# Patient Record
Sex: Female | Born: 1970 | Race: White | Hispanic: No | Marital: Married | Smoking: Never smoker
Health system: Southern US, Community
[De-identification: ages and names within clinical notes are randomized; demographics above are authoritative.]

## PROBLEM LIST (undated history)

## (undated) DIAGNOSIS — E119 Type 2 diabetes mellitus without complications: Secondary | ICD-10-CM

---

## 2001-06-15 ENCOUNTER — Other Ambulatory Visit: Admission: RE | Admit: 2001-06-15 | Discharge: 2001-06-15 | Payer: Self-pay | Admitting: Obstetrics and Gynecology

## 2001-06-21 ENCOUNTER — Ambulatory Visit (HOSPITAL_COMMUNITY): Admission: RE | Admit: 2001-06-21 | Discharge: 2001-06-21 | Payer: Self-pay | Admitting: Endocrinology

## 2001-06-30 ENCOUNTER — Encounter: Payer: Self-pay | Admitting: Obstetrics and Gynecology

## 2001-06-30 ENCOUNTER — Ambulatory Visit (HOSPITAL_COMMUNITY): Admission: RE | Admit: 2001-06-30 | Discharge: 2001-06-30 | Payer: Self-pay | Admitting: Obstetrics and Gynecology

## 2002-06-16 ENCOUNTER — Other Ambulatory Visit: Admission: RE | Admit: 2002-06-16 | Discharge: 2002-06-16 | Payer: Self-pay | Admitting: Obstetrics and Gynecology

## 2003-04-17 ENCOUNTER — Encounter (INDEPENDENT_AMBULATORY_CARE_PROVIDER_SITE_OTHER): Payer: Self-pay | Admitting: Specialist

## 2003-04-17 ENCOUNTER — Ambulatory Visit (HOSPITAL_COMMUNITY): Admission: RE | Admit: 2003-04-17 | Discharge: 2003-04-17 | Payer: Self-pay | Admitting: Obstetrics and Gynecology

## 2003-10-30 ENCOUNTER — Other Ambulatory Visit: Admission: RE | Admit: 2003-10-30 | Discharge: 2003-10-30 | Payer: Self-pay | Admitting: Obstetrics and Gynecology

## 2004-04-16 ENCOUNTER — Inpatient Hospital Stay (HOSPITAL_COMMUNITY): Admission: AD | Admit: 2004-04-16 | Discharge: 2004-04-16 | Payer: Self-pay | Admitting: Obstetrics and Gynecology

## 2004-04-16 ENCOUNTER — Ambulatory Visit (HOSPITAL_COMMUNITY): Admission: RE | Admit: 2004-04-16 | Discharge: 2004-04-16 | Payer: Self-pay | Admitting: Obstetrics and Gynecology

## 2004-04-24 ENCOUNTER — Inpatient Hospital Stay (HOSPITAL_COMMUNITY): Admission: AD | Admit: 2004-04-24 | Discharge: 2004-04-24 | Payer: Self-pay | Admitting: *Deleted

## 2006-04-13 ENCOUNTER — Inpatient Hospital Stay (HOSPITAL_COMMUNITY): Admission: RE | Admit: 2006-04-13 | Discharge: 2006-04-16 | Payer: Self-pay | Admitting: Obstetrics and Gynecology

## 2006-04-13 ENCOUNTER — Encounter (INDEPENDENT_AMBULATORY_CARE_PROVIDER_SITE_OTHER): Payer: Self-pay | Admitting: *Deleted

## 2007-11-22 ENCOUNTER — Ambulatory Visit (HOSPITAL_COMMUNITY): Admission: RE | Admit: 2007-11-22 | Discharge: 2007-11-22 | Payer: Self-pay | Admitting: Obstetrics and Gynecology

## 2007-12-13 ENCOUNTER — Ambulatory Visit (HOSPITAL_COMMUNITY): Admission: RE | Admit: 2007-12-13 | Discharge: 2007-12-13 | Payer: Self-pay | Admitting: Obstetrics and Gynecology

## 2008-01-11 ENCOUNTER — Ambulatory Visit (HOSPITAL_COMMUNITY): Admission: RE | Admit: 2008-01-11 | Discharge: 2008-01-11 | Payer: Self-pay | Admitting: Obstetrics and Gynecology

## 2008-02-08 ENCOUNTER — Ambulatory Visit (HOSPITAL_COMMUNITY): Admission: RE | Admit: 2008-02-08 | Discharge: 2008-02-08 | Payer: Self-pay | Admitting: Obstetrics and Gynecology

## 2008-03-20 ENCOUNTER — Inpatient Hospital Stay (HOSPITAL_COMMUNITY): Admission: AD | Admit: 2008-03-20 | Discharge: 2008-03-20 | Payer: Self-pay | Admitting: Obstetrics and Gynecology

## 2008-03-26 ENCOUNTER — Inpatient Hospital Stay (HOSPITAL_COMMUNITY): Admission: AD | Admit: 2008-03-26 | Discharge: 2008-03-31 | Payer: Self-pay | Admitting: Obstetrics

## 2008-04-16 ENCOUNTER — Encounter (INDEPENDENT_AMBULATORY_CARE_PROVIDER_SITE_OTHER): Payer: Self-pay | Admitting: Obstetrics and Gynecology

## 2008-04-16 ENCOUNTER — Ambulatory Visit: Payer: Self-pay | Admitting: Vascular Surgery

## 2008-04-16 ENCOUNTER — Ambulatory Visit: Admission: RE | Admit: 2008-04-16 | Discharge: 2008-04-16 | Payer: Self-pay | Admitting: Obstetrics and Gynecology

## 2008-04-27 ENCOUNTER — Inpatient Hospital Stay (HOSPITAL_COMMUNITY): Admission: RE | Admit: 2008-04-27 | Discharge: 2008-04-29 | Payer: Self-pay | Admitting: Obstetrics and Gynecology

## 2010-01-28 ENCOUNTER — Ambulatory Visit (HOSPITAL_COMMUNITY): Admission: RE | Admit: 2010-01-28 | Discharge: 2010-01-28 | Payer: Self-pay | Admitting: Obstetrics and Gynecology

## 2010-11-02 ENCOUNTER — Encounter: Payer: Self-pay | Admitting: Obstetrics and Gynecology

## 2010-12-30 LAB — BASIC METABOLIC PANEL
Chloride: 102 mEq/L (ref 96–112)
Creatinine, Ser: 0.54 mg/dL (ref 0.4–1.2)
GFR calc Af Amer: >60 mL/min (ref >60)
Sodium: 136 mEq/L (ref 135–145)

## 2010-12-30 LAB — CBC
MCV: 88.3 fL (ref 78.0–100.0)
RBC: 4.03 MIL/uL (ref 3.87–5.11)
WBC: 8.1 10*3/uL (ref 4.0–10.5)

## 2010-12-30 LAB — GLUCOSE, CAPILLARY
Glucose-Capillary: 86 mg/dL (ref 70–99)
Glucose-Capillary: 99 mg/dL (ref 70–99)

## 2011-02-24 NOTE — Op Note (Signed)
Valerie Hunt, Valerie Hunt             ACCOUNT NO.:  [id number]   MEDICAL RECORD NO.:  [id number]          PATIENT TYPE:  INP   LOCATION:  9121                          [facility]   PHYSICIAN:  Maxie Better, M.D.DATE OF BIRTH:  1970/11/14   DATE OF PROCEDURE:  04/27/2008  DATE OF DISCHARGE:                               OPERATIVE REPORT   PREOPERATIVE DIAGNOSES:  1. Previous cesarean section.  2. Term gestation.  3. Class B diabetes.  4. Oligohydramnios.   POSTOPERATIVE DIAGNOSES:  1. Previous cesarean section.  2. Term gestation.  3. Class B diabetes.   PROCEDURE:  1. Repeat cesarean section.  2. Sharl Ma hysterotomy.   ANESTHESIA:  Spinal.   SURGEON:  Maxie Better, MD   ASSISTANT:  Genia Del, MD   PROCEDURE NOTE:  Under adequate spinal anesthesia, the patient was  placed in the supine position with a left lateral tilt.  She was  sterilely prepped and draped in usual fashion.  Indwelling Foley  catheter was sterilely placed.  Marcaine 0.25% was injected along the  previous Pfannenstiel skin incision.  Pfannenstiel skin incision was  then made and carried down to the rectus fascia.  The rectus fascia was  then opened transversely.  The rectus fascia was then bluntly and  sharply dissected off the rectus muscle in superior and inferior  fashion.  The rectus muscle was split in midline.  The parietal  peritoneum was entered bluntly and extended.  The vesicouterine  peritoneum was opened transversely.  The bladder was bluntly dissected  off the lower uterine segment and displaced inferiorly.  A curvilinear  low transverse uterine incision was then made and extended with bandage  scissors.  Artificial rupture of membranes was performed.  Copious clear  fluid was noted.  Subsequent delivery was unsuccessful.  Vacuum was  applied due to the floating vertex.  Subsequent delivery of a live  female was accomplished.  Baby was bulb suctioned and the abdominal  cord  was clamped and cut.  The baby was transferred to the awaiting  pediatrician who assigned Apgars of 9 and 9 at 1 and 5 minutes.  Placenta which was anterior was spontaneous.  Uterine cavity was cleaned  of debris.  Uterine incision had no extension.  The uterine incision was  closed in 2 layers, the first layer of 0 Monocryl running lock stitch,  second layer was imbricated using 0 Monocryl suture.  Good hemostasis  was noted.  Right tube and ovaries were normal.  Left tube was normal.  Left ovary had endometriosis and at least evidence of an old ruptured  endometriosis in the surface, otherwise normal.  The abdomen was  copiously irrigated and suctioned of debris.  The parietal peritoneum  was not closed.  The rectus fascia was closed with 0 Vicryl x2.  The  subcutaneous area which was about 3 inches thick, was closed with 2  layers of 2-0 plain sutures.  The skin was approximated using Ethicon  staples.  Specimen was placenta, not sent to Pathology.  Estimated blood  loss was 800  mL.  Intraoperative fluid 2900 mL.  Urine  output 450 mL, clear yellow  urine.  Sponge and instrument counts x2 were correct.  Complications  none.  Weight of the baby was 7 pounds 4 ounces.  The patient tolerated  the procedure well and was transferred to recovery in stable condition.      Maxie Better, M.D.  Electronically Signed     [place]/MEDQ  D:  04/27/2008  T:  04/28/2008  Job:  [id number]

## 2011-02-27 NOTE — Op Note (Signed)
Valerie Hunt, Valerie Hunt             ACCOUNT NO.:  [id number]   MEDICAL RECORD NO.:  [id number]          PATIENT TYPE:  INP   LOCATION:  9304                          [facility]   PHYSICIAN:  Maxie Better, M.D.DATE OF BIRTH:  1971-07-01   DATE OF PROCEDURE:  04/13/2006  DATE OF DISCHARGE:                                 OPERATIVE REPORT   PREOPERATIVE DIAGNOSES:  Breech presentation, insulin dependent diabetes,  intrauterine gestation at 39 weeks, possible fetal cardiac anomaly.   PROCEDURE:  Primary cesarean section, KERR hysterotomy.   POSTOPERATIVE DIAGNOSES:  Incomplete breech presentation, insulin-dependent  diabetes, intrauterine gestation at 39 weeks, possible fetal cardiac  anomalies   ANESTHESIA:  Spinal.   SURGEON:  Maxie Better, M.D.   ASSISTANT:  Marlinda Mike, C.N.M.   PROCEDURE:  Under adequate spinal anesthesia, the patient was placed in the  supine position with a left lateral tilt.  She was sterilely prepped and  draped in the usual fashion.  An indwelling Foley catheter was sterilely  placed.  10 mL of 0.25% Marcaine was injected along the planned Pfannenstiel  skin incision.  Pfannenstiel skin incision was then made, carried down to  the rectus fascia.  Rectus fascia was incised in midline and extended  bilaterally.  The rectus fascia was then bluntly and sharply dissected off  the rectus muscles in superior and inferior fashion.  The rectus muscles  were split in the midline.  The parietal peritoneum was entered bluntly and  extended.  The vesicouterine peritoneum was opened transversely and bladder  bluntly dissected off of the lower uterine segment.  A lower uterine segment  curvilinear uterine incision was then made, extended bilaterally using  bandage scissors.  Subsequent delivery of a live female, from an incomplete  breech position, was accomplished, using the usual breech maneuver.  The  cord was noted to be around the left arm and around  the neck of the baby.  The baby was bulb suctioned on the abdomen.  Cord was clamped, cut.  The  baby was transferred to the awaiting pediatricians who assigned Apgars of 9  and 9 at one and five minutes.  Cord pH was obtained and pending at the time  of this dictation.  The placenta was manually removed and was small and  ratty in appearance, and sent to pathology.  Uterine cavity was cleaned of  debris.  Uterus was exteriorized.  No uterine anomalies noted.  Normal tubes  and ovaries were noted bilaterally.  Uterine cavity was cleaned of debris.  Uterine incision had no extension.  Uterine incision was closed in two  layers, the first with 0 Monocryl in running lock stitch, the second layer  was imbricating using 0 Monocryl suture.  Uterus was then returned to the  abdomen.  Good hemostasis was noted.  The abdomen was copiously irrigated,  suctioned of debris.  Appendix was noted to be normal.  The parietal  peritoneum was then closed with 2-0 Vicryl.  The fascia was closed with 0  Vicryl x2.  The subcutaneous area was irrigated, small bleeders cauterized.  Interrupted 2-0 plain sutures were  then utilized to close the two inch depth  subcutaneous defect.  The skin was approximated using Ethicon staples.  Specimen was placenta sent to pathology.  Estimated blood loss was 1000 mL.  Intraoperative fluid was 2300 mL crystalloid.  Urine output was 200 mL clear  yellow urine.  Sponge and instrument  counts x2 was correct.  Complication was none.  Weight of the baby was 5  pounds 14 ounces.  He was transferred to the NICU due to his possible fetal  cardiac anomaly.  The patient was transferred to the recovery room in stable  condition.      Maxie Better, M.D.  Electronically Signed     [place]/MEDQ  D:  04/13/2006  T:  04/13/2006  Job:  [id number]

## 2011-02-27 NOTE — Discharge Summary (Signed)
NAMESHONICA, Valerie Hunt             ACCOUNT NO.:  [id number]   MEDICAL RECORD NO.:  [id number]          PATIENT TYPE:  INP   LOCATION:  9151                          [facility]   PHYSICIAN:  Maxie Better, M.D.DATE OF BIRTH:  Jan 14, 1971   DATE OF ADMISSION:  03/26/2008  DATE OF DISCHARGE:  03/31/2008                               DISCHARGE SUMMARY   ADMISSION DIAGNOSES:  1. Oligohydramnios.  2. Class B diabetes.  3. Intrauterine gestation at 34-3/7 weeks.   DISCHARGE DIAGNOSES:  1. Oligohydramnios.  2. Class B diabetes.  3. Intrauterine gestation at 35-1/7 weeks, undelivered.   HISTORY OF PRESENT ILLNESS:  A 36-year, gravida 6, para 1-0-4-1 female  at 34-3/7 weeks with class B diabetes admitted for aggressive IV  hydration secondary to oligohydramnios.   HOSPITAL COURSE:  The patient was admitted to [facility] for  aggressive IV hydration after the finding of oligohydramnios on  ultrasound.  A 48 hours later, repeat amniotic fluid index was obtained,  which did show oligohydramnios.  The patient stayed for an additional 48  hours of IV fluids with a repeat AFI and an estimated fetal weight again  obtained at that time.  The repeat AFI on March 31, 2008, initially  showed AFI of 7.3, which is still low, estimated fetal weight of 7  pounds 2 ounces.  Throughout this time, the patient was on continuous  fetal monitoring that showed a reactive nonstress test with no evidence  of contractions.  Home consultation with the Maternal-Fetal Medicine was  obtained due to ongoing persistent oligohydramnios in the setting of  beautifully reactive nonstress test.  Biophysical profile and Dopplers  were recommended, and if they were normal, to manage this patient as an  outpatient, so BPP and Doppler were obtained.  Biophysical profile was  8/8.  Dopplers were normal and the amniotic fluid index the same day was  9.4, which had increased.   Given the findings, the patient was  sent home.   Disposition is home.   CONDITION:  Stable.   DISCHARGE MEDICATIONS:  1. Metformin 1000 mg p.o. b.i.d.  2. Lantus 33 units q.p.m.  3. Prenatal vitamins 1 p.o. daily.   FOLLOWUP APPOINTMENTS:  [facility] on Monday and Thursday.   DISCHARGE INSTRUCTIONS:  Call for decreased fetal movement, rupture of  membranes, and labor precautions.      Maxie Better, M.D.  Electronically Signed     [place]/MEDQ  D:  05/03/2008  T:  05/03/2008  Job:  [id number]

## 2011-02-27 NOTE — Procedures (Signed)
[place]  Patient:    Valerie Hunt, Valerie Hunt Visit Number: [id number] MRN: [id number]          Service Type: Attending:  Sharin Grave, M.D. Dictated by:   Sharin Grave, M.D. Proc. Date: 06/21/01                             Procedure Report  RADIOLOGY REPORT [facility])  DATE OF BIRTH: [birth year]  PROCEDURE: Thyroid uptake and scan.  ORDERING PHYSICIAN: Sharin Grave, M.D.  DOSE OF RADIOACTIVITY:  1. Iodine 123 capsule, 94 microcurie.  2. Technetium 99-M 10 millicurie.  The patient has a history of hypothyroidism.  The patient was studied while on Cytomel 50 mcg q.d.  The purpose of this study is to evaluate for the possibility of autonomous functioning thyroid tissue that is not suppressed by thyroid medication.  It was also to find out the presence or absence of any nodules.  The iodine uptake was low at 1.7% at 24 hours, which is expected.  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  There were no cold or hot nodules seen in the thyroid.  IMPRESSION:  1. 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  2. No evidence of nodules.  3. Low uptake consistent with the history that the patient is taking thyroid     medications. Dictated by:   Sharin Grave, M.D. Attending:  Sharin Grave, M.D. DD:  06/28/01 TD:  06/29/01 Job: [id number]

## 2011-02-27 NOTE — Discharge Summary (Signed)
Valerie Hunt, Valerie Hunt             ACCOUNT NO.:  [id number]   MEDICAL RECORD NO.:  [id number]          PATIENT TYPE:  INP   LOCATION:  9304                          [facility]   PHYSICIAN:  Maxie Better, M.D.DATE OF BIRTH:  1971/04/14   DATE OF ADMISSION:  04/13/2006  DATE OF DISCHARGE:  04/16/2006                                 DISCHARGE SUMMARY   ADMISSION DIAGNOSES:  1.  Breech presentation.  2.  Insulin-dependent diabetes mellitus.  3.  Pregnancy at 39 weeks.   DISCHARGE DIAGNOSES:  1.  Incomplete breech presentation.  2.  Intrauterine gestation at 39 weeks, delivered.  3.  Insulin-dependent diabetes.  4.  Postoperative anemia.   PROCEDURE:  Primary cesarean section.   HOSPITAL COURSE:  The patient was admitted to [facility].  She was  taken to the operating room where she underwent a primary cesarean section.  The procedure resulted in a delivery of a live female with the cord around the  neck and arm weighing 5 pounds 14 ounces, Apgar's 9 and 9.  The baby had  been suspected of having a fetal cardiac anomaly(double outlet right  ventricle).  Therefore, after delivery, the baby was taken to the NICU.  The  baby subsequently underwent a fetal echocardiogram, chest x-ray and was  transferred due to his cardiac condition of a double outlet right ventricle.  The patient had an uncomplicated postoperative course.  Her hemoglobin was  7.7 and hematocrit of 22.2 postoperatively.  The patient was not  symptomatic.  Her blood sugars were followed closely.  Her blood loss was  thought secondary to uterine atony resulting acute blood loss.  Methergine  was started.  Iron supplementation was started and transfusion was deferred.  The patient continued to do well.  She was started on Lantus.  By postop day  #3, the patient was stable.  Incision had no erythema, induration or  drainage.  She was to be discharged home.  She was rubella nonimmune and  rubella vaccine was  given.  Her diabetes was going to be managed by the  diabetic coordinator at [facility].   DISPOSITION:  Home.   CONDITION ON DISCHARGE:  Stable.   SPECIAL INSTRUCTIONS:  Per the course booklet given.   FOLLOW UP:  [facility] in 2 weeks for incision check and 6 weeks  postpartum.   DISCHARGE MEDICATIONS:  1.  Percocet one to two tablets every 4-6 hours p.r.n. pain.  2.  Prenatal vitamins one p.o. daily.  3.  Niferex Forte one p.o. b.i.d.  4.  Insulin per the diabetes coordinator.      Maxie Better, M.D.  Electronically Signed     [place]/MEDQ  D:  05/10/2006  T:  05/10/2006  Job:  [id number]

## 2011-02-27 NOTE — Discharge Summary (Signed)
NAMECONSTANCE, Valerie Hunt             ACCOUNT NO.:  [id number]   MEDICAL RECORD NO.:  [id number]          PATIENT TYPE:  INP   LOCATION:  9121                          [facility]   PHYSICIAN:  Maxie Better, M.D.DATE OF BIRTH:  May 10, 1971   DATE OF ADMISSION:  04/27/2008  DATE OF DISCHARGE:  04/29/2008                               DISCHARGE SUMMARY   ADMISSION DIAGNOSES:  Oligohydramnios class B diabetes term gestation,  previous cesarean section   DISCHARGE DIAGNOSES:  Previous cesarean section, term gestation  delivered, class B diabetes, and oligohydramnios, anemia   PROCEDURE:  Repeat cesarean section.   HOSPITAL COURSE:  The patient was admitted to [facility].  She was  taken to the operating room where she underwent a repeat cesarean  section. A procedure resulted in delivery of a 7 pounds 4 ounces live  female, Apgars of 9 and 9.  The tubes and ovaries were normal.  The left  ovary had some supervision endometriosis.  Postoperatively, the patient  did well.  She was continued on her insulin and the metformin regimen.  Her blood sugars remained controlled.  On postop day #2, the patient  requested early discharge.  Incision had no evidence of infection, she  was passing gas and was deemed well to be discharged home.  Her rubella  is indeterminate; however, the rubella is not available for vaccinations  so the patient will get that on an outpatient basis.  CBC on postop day  #1, showed a hemoglobin 9.2, hematocrit 26.7, white count of 8.4, and  platelet count 176,000.   DISPOSITION:  Home.   CONDITION:  Stable.   DISCHARGE MEDICATIONS:  1. Tylox 1-2 tablets every 3-4 ounces p.r.n. pain.  2. Metformin 500 mg p.o. b.i.d.  3. Prenatal vitamins one p.o. day.   Followup appointment at [facility] in 2 days for staple removal in  6 weeks postpartum.   Discharge instructions per the postpartum booklet given.  Follow up with  respect to her blood sugars with  the [facility].      Maxie Better, M.D.  Electronically Signed     [place]/MEDQ  D:  05/26/2008  T:  05/26/2008  Job:  [id number]

## 2011-02-27 NOTE — Op Note (Signed)
   NAMEARMA, Valerie Hunt                         ACCOUNT NO.:  [id number]   MEDICAL RECORD NO.:  [id number]                   PATIENT TYPE:  AMB   LOCATION:  SDC                                  [facility]   PHYSICIAN:  Maxie Better, M.D.            DATE OF BIRTH:  1971/08/14   DATE OF PROCEDURE:  04/17/2003  DATE OF DISCHARGE:                                 OPERATIVE REPORT   PREOPERATIVE DIAGNOSES:  Missed abortion, twin gestation at seven weeks.   POSTOPERATIVE DIAGNOSES:  Missed abortion, twin gestation at seven weeks.   PROCEDURE:  Suction dilation and evacuation.   ANESTHESIA:  MAC paracervical block.   SURGEON:  Maxie Better, M.D.   INDICATIONS FOR PROCEDURE:  This is a 40 year old, gravida 1, para 0 female  with a long history of infertility who was diagnosed with missed abortion  twin gestation about three weeks ago and who now presents for surgical  management. The patient has had intermittent vaginal bleeding. The risks and  benefits of the procedure have been explained to the patient, consent has  been signed. The patient is noted to be Rh positive.   DESCRIPTION OF PROCEDURE:  Under adequate monitored anesthesia, the patient  was placed in the dorsal lithotomy position. She was sterilely prepped and  draped in the usual fashion, the bladder was catheterized with a large  amount of urine. Bimanual examination revealed an eight week anteverted  uterus, no adnexal masses could be appreciated. A bivalve speculum was  placed in the vagina, 20 mL of 1% Nesacaine was injected paracervically.  The single-tooth tenaculum was placed in the anterior lip of the cervix. The  cervix was easily dilated up to a #31 Pratt dilator. A #8 mm curved suction  cannula was introduced into the uterine cavity without incident. A large  amount of products of conception was obtained. The cavity was then curetted,  resuctioned and when all tissue was felt to have been removed,  all  instruments were then removed from the vagina. The specimen labeled products  of conception were sent to pathology. Estimated blood loss was minimal.  Complications none. The patient tolerated the procedure well and was  transferred to the recovery room in stable condition.                                               Maxie Better, M.D.    /MEDQ  D:  04/17/2003  T:  04/17/2003  Job:  [id number]

## 2011-07-09 LAB — STREP B DNA PROBE: Strep Group B Ag: NEGATIVE

## 2011-07-10 LAB — BASIC METABOLIC PANEL
BUN: 6
Calcium: 9.1
GFR calc non Af Amer: >60
Glucose, Bld: 135 — ABNORMAL HIGH
Sodium: 138

## 2011-07-10 LAB — CBC
HCT: 26.7 — ABNORMAL LOW
Hemoglobin: 9.2 — ABNORMAL LOW
MCHC: 34.3
MCV: 88.3
Platelets: 219
RBC: 3.02 — ABNORMAL LOW
RDW: 14.1

## 2011-07-10 LAB — GLUCOSE, CAPILLARY
Glucose-Capillary: 119 — ABNORMAL HIGH
Glucose-Capillary: 119 — ABNORMAL HIGH
Glucose-Capillary: 70
Glucose-Capillary: 75
Glucose-Capillary: 86
Glucose-Capillary: 96

## 2014-08-23 ENCOUNTER — Other Ambulatory Visit (HOSPITAL_COMMUNITY): Payer: Self-pay | Admitting: Pulmonary Disease

## 2014-08-23 DIAGNOSIS — M542 Cervicalgia: Secondary | ICD-10-CM

## 2014-08-28 ENCOUNTER — Ambulatory Visit (HOSPITAL_COMMUNITY)
Admission: RE | Admit: 2014-08-28 | Discharge: 2014-08-28 | Disposition: A | Discharge status: Home / Self Care | Payer: BC Managed Care – PPO | Source: Ambulatory Visit | Attending: Pulmonary Disease | Admitting: Pulmonary Disease

## 2014-08-28 DIAGNOSIS — M479 Spondylosis, unspecified: Secondary | ICD-10-CM | POA: Diagnosis not present

## 2014-08-28 DIAGNOSIS — M5022 Other cervical disc displacement, mid-cervical region: Secondary | ICD-10-CM | POA: Insufficient documentation

## 2014-08-28 DIAGNOSIS — M542 Cervicalgia: Secondary | ICD-10-CM | POA: Insufficient documentation

## 2014-08-28 DIAGNOSIS — M2578 Osteophyte, vertebrae: Secondary | ICD-10-CM | POA: Insufficient documentation

## 2016-01-03 ENCOUNTER — Encounter (HOSPITAL_COMMUNITY): Payer: Self-pay | Admitting: Emergency Medicine

## 2016-01-03 ENCOUNTER — Emergency Department (HOSPITAL_COMMUNITY): Payer: BLUE CROSS/BLUE SHIELD

## 2016-01-03 ENCOUNTER — Emergency Department (HOSPITAL_COMMUNITY)
Admission: EM | Admit: 2016-01-03 | Discharge: 2016-01-03 | Disposition: A | Discharge status: Home / Self Care | Payer: BLUE CROSS/BLUE SHIELD | Attending: Emergency Medicine | Admitting: Emergency Medicine

## 2016-01-03 DIAGNOSIS — E119 Type 2 diabetes mellitus without complications: Secondary | ICD-10-CM | POA: Insufficient documentation

## 2016-01-03 DIAGNOSIS — M7532 Calcific tendinitis of left shoulder: Secondary | ICD-10-CM | POA: Diagnosis not present

## 2016-01-03 DIAGNOSIS — M25512 Pain in left shoulder: Secondary | ICD-10-CM | POA: Diagnosis present

## 2016-01-03 HISTORY — DX: Type 2 diabetes mellitus without complications: E11.9

## 2016-01-03 MED ORDER — DICLOFENAC SODIUM 75 MG PO TBEC: 75.0000 mg | DELAYED_RELEASE_TABLET | Freq: Two times a day (BID) | ORAL | Status: DC

## 2016-01-03 MED ORDER — DICLOFENAC SODIUM 75 MG PO TBEC: 75.0000 mg | DELAYED_RELEASE_TABLET | Freq: Two times a day (BID) | ORAL | Status: AC

## 2016-01-03 MED ORDER — HYDROCODONE-ACETAMINOPHEN 5-325 MG PO TABS: ORAL_TABLET | ORAL | Status: AC

## 2016-01-03 MED ORDER — OXYCODONE-ACETAMINOPHEN 5-325 MG PO TABS
1.0000 | ORAL_TABLET | Freq: Once | ORAL | Status: AC
  Administered 2016-01-03: 1 via ORAL
  Filled 2016-01-03: qty 1

## 2016-01-03 MED ORDER — IBUPROFEN 800 MG PO TABS
800.0000 mg | ORAL_TABLET | Freq: Once | ORAL | Status: AC
  Administered 2016-01-03: 800 mg via ORAL
  Filled 2016-01-03: qty 1

## 2016-01-03 MED ORDER — HYDROCODONE-ACETAMINOPHEN 7.5-325 MG PO TABS: 1.0000 | ORAL_TABLET | Freq: Four times a day (QID) | ORAL | Status: DC | PRN

## 2016-01-03 NOTE — Discharge Instructions (Signed)
Calcific Tendinitis Calcific tendinitis occurs when crystals of calcium are deposited in a tendon. Tendons are bands of strong, fibrous tissue that attach muscles to bones. Tendons are an important part of joints. They make the joint move and they absorb some of the stress that a joint receives during use. When calcium is deposited in the tendon, the tendon becomes stiff, painful, and it can become swollen. Calcific tendinitis occurs frequently in the shoulder joint, in a structure called the rotator cuff. CAUSES  The cause of calcific tendinitis is unclear. It may be associated with:  Overuse of the tendon, such as from repetitive motion.  Excess stress on the tendon.  Aging.  Repetitive, mild injuries. SYMPTOMS   Pain may or may not be present. If it is present, it may occur when moving the joint.  Tenderness when pressure is applied to the tendon.  A snapping or popping sound when the joint moves.  Decreased motion of the joint.  Difficulty sleeping due to pain in the joint. DIAGNOSIS  Your health care provider will perform a physical exam. Imaging tests may also be used to make the diagnosis. These may include X-rays, an MRI, or a CT scan. TREATMENT  Generally, calcific tendinitis resolves on its own. Treatment for pain of calcific tendinitis may include:  Taking over-the-counter medicines, such as anti-inflammatory drugs.  Applying ice packs to the joint.  Following a specific exercise program to keep the joint working properly.  Attending physical therapy sessions.  Avoiding activities that cause pain. Treatment for more severe calcific tendinitis may require:  Injecting cortisone steroids or pain relieving medicines into or around the joint.  Manipulating the joint after you are given medicine to numb the area (local anesthetic).  Inflating the joint with sterile fluid to increase the flexibility of the tendons.  Shock wave therapy, which involves focusing sound  waves on the joint. If other treatments do not work, surgery may be done to clean out the calcium deposits and repair the tendons where needed. Most people do not need surgery. HOME CARE INSTRUCTIONS   Only take over-the-counter or prescription medicines for pain, fever, or discomfort as directed by your health care provider.  Follow your health care provider's recommendations for activity and exercise. SEEK MEDICAL CARE IF:  You notice an increase in pain or numbness.  You develop new weakness.  You notice increased joint stiffness or a sensation of looseness in the joint.  You notice increasing redness, swelling, or warmth around the joint area. SEEK IMMEDIATE MEDICAL CARE IF:  You have a fever or persistent symptoms for more than 2 to 3 days.  You have a fever and your symptoms suddenly get worse. MAKE SURE YOU:  Understand these instructions.  Will watch your condition.  Will get help right away if you are not doing well or get worse.   This information is not intended to replace advice given to you by your health care provider. Make sure you discuss any questions you have with your health care provider.   Document Released: 07/07/2008 Document Revised: 06/19/2015 Document Reviewed: 01/07/2012 Elsevier Interactive Patient Education [employer].

## 2016-01-03 NOTE — ED Notes (Addendum)
Pt c/o left shoulder/arm pain since she was holding her son yesterday. Hurts worse with movement

## 2016-01-06 NOTE — ED Provider Notes (Signed)
CSN: [id number]     Arrival date & time 01/03/16  1812 History   First MD Initiated Contact with Patient 01/03/16 2030     Chief Complaint  Patient presents with  . Arm Pain     (Consider location/radiation/quality/duration/timing/severity/associated sxs/prior Treatment) HPI   Valerie Hunt is a 45 y.o. female who presents to the Emergency Department complaining of gradually worsening left shoulder pain.  She states the pain began after her son fell asleep lying against her left arm.  She reports pain with attempting to raise her arm.  Pain improves when held closely to her side.  She has tried OTC analgesics.  She denies neck pain, fever, swelling, chest pain, numbness or weakness of the extremity.   Past Medical History  Diagnosis Date  . Diabetes mellitus without complication [facility])    Past Surgical History  Procedure Laterality Date  . Cesarean section     History reviewed. No pertinent family history. Social History  Substance Use Topics  . Smoking status: Never Smoker   . Smokeless tobacco: None  . Alcohol Use: No   OB History    No data available     Review of Systems  Constitutional: Negative for fever and chills.  Respiratory: Negative for shortness of breath.   Cardiovascular: Negative for chest pain.  Musculoskeletal: Positive for arthralgias (left shoulder pain). Negative for joint swelling and neck pain.  Skin: Negative for color change and wound.  All other systems reviewed and are negative.     Allergies  Review of patient's allergies indicates not on file.  Home Medications   Prior to Admission medications   Medication Sig Start Date End Date Taking? Authorizing Provider  diclofenac (VOLTAREN) 75 MG EC tablet Take 1 tablet (75 mg total) by mouth 2 (two) times daily. Take with food 01/03/16   Ophie Burrowes, PA-C  HYDROcodone-acetaminophen (NORCO/VICODIN) 5-325 MG tablet Take one-two tabs po q 4-6 hrs prn pain 01/03/16   Nivaan Dicenzo, PA-C   HYDROcodone-acetaminophen (NORCO/VICODIN) 5-325 MG tablet Take one-two tabs po q 4-6 hrs prn pain 01/03/16   Khyson Sebesta, PA-C   BP 169/87 mmHg  Pulse 91  Temp(Src) 98.9 F (37.2 C) (Oral)  Resp 16  Ht 5\' 5"  (1.651 m)  Wt 113.399 kg  BMI 41.60 kg/m2  SpO2 98% Physical Exam  Constitutional: She is oriented to person, place, and time. She appears well-developed and well-nourished. No distress.  HENT:  Head: Normocephalic and atraumatic.  Neck: Normal range of motion. Neck supple. No thyromegaly present.  Cardiovascular: Normal rate, regular rhythm and intact distal pulses.   No murmur heard. Pulmonary/Chest: Effort normal and breath sounds normal. No respiratory distress. She exhibits no tenderness.  Musculoskeletal: She exhibits tenderness. She exhibits no edema.  Focal ttp of the left shoulder.  Pain with abduction of the left arm.  Radial pulse is brisk, distal sensation intact, CR< 2 sec. Grip strength is strong and symmetrical.   No edema , erythema or step-off deformity of the joint.   Lymphadenopathy:    She has no cervical adenopathy.  Neurological: She is alert and oriented to person, place, and time. She has normal strength. No sensory deficit. She exhibits normal muscle tone. Coordination normal.  Reflex Scores:      Tricep reflexes are 1+ on the right side and 2+ on the left side.      Bicep reflexes are 1+ on the right side and 2+ on the left side. Skin: Skin is warm and dry.  Nursing note and vitals reviewed.   ED Course  Procedures (including critical care time) Labs Review Labs Reviewed - No data to display  Imaging Review Dg Shoulder Left  01/03/2016  CLINICAL DATA:  Left shoulder pain, left arm pain for 2 days EXAM: LEFT SHOULDER - 2+ VIEW COMPARISON:  None. FINDINGS: Two views of the left shoulder submitted. No acute fracture or subluxation. Mild degenerative changes AC joint. Small calcifications are noted adjacent to humeral head suspicious for calcific  tendinopathy. IMPRESSION: No acute fracture or subluxation. Mild degenerative changes acromioclavicular joint. Small calcification adjacent to humeral head suspicious for calcific tendinosis. Electronically Signed   By: Lahoma Crocker M.D.   On: 01/03/2016 20:16   Dg Humerus Left  01/03/2016  CLINICAL DATA:  Patient fell asleep in a chair and states her 6 year old son came and sat on her arm to sleep and she woke up with pain in left arm and shoulder unable to move it for 2 days. EXAM: LEFT HUMERUS - 2+ VIEW COMPARISON:  None. FINDINGS: Degenerative changes in the left shoulder. Calcification over the lateral left humerus suggesting calcific tendinosis. No evidence of acute fracture or dislocation. Soft tissues are unremarkable. IMPRESSION: No acute bony abnormalities. 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 Electronically Signed   By: Lucienne Capers M.D.   On: 01/03/2016 20:14    I have personally reviewed and evaluated these images and lab results as part of my medical decision-making.   EKG Interpretation None      MDM   Final diagnoses:  Calcific tendinitis of shoulder, left    Pt with tenderness at the left shoulder joint.  XR shows calcific tendinosis.  No concerning sx's for septic joint.  NV intact.  Pt agrees to symptomatic tx with ice, NSAID, and vicodin.  Referral for ortho is not improving.  Pt stable for d/c    Kem Parkinson, PA-C 01/06/16 [place], MD 01/08/16 [phone]

## 2016-01-09 ENCOUNTER — Ambulatory Visit: Payer: BLUE CROSS/BLUE SHIELD | Admitting: Orthopedic Surgery

## 2016-01-09 MED FILL — Hydrocodone-Acetaminophen Tab 5-325 MG: ORAL | Qty: 6 | Status: AC

## 2016-01-14 ENCOUNTER — Ambulatory Visit: Payer: BLUE CROSS/BLUE SHIELD | Admitting: Orthopedic Surgery

## 2016-03-11 ENCOUNTER — Other Ambulatory Visit: Payer: Self-pay | Admitting: Neurosurgery

## 2016-03-11 DIAGNOSIS — M5020 Other cervical disc displacement, unspecified cervical region: Secondary | ICD-10-CM

## 2016-03-17 ENCOUNTER — Ambulatory Visit
Admission: RE | Admit: 2016-03-17 | Discharge: 2016-03-17 | Disposition: A | Discharge status: Home / Self Care | Payer: BLUE CROSS/BLUE SHIELD | Source: Ambulatory Visit | Attending: Neurosurgery | Admitting: Neurosurgery

## 2016-03-17 DIAGNOSIS — M5020 Other cervical disc displacement, unspecified cervical region: Secondary | ICD-10-CM

## 2016-03-17 MED ORDER — IOPAMIDOL (ISOVUE-300) INJECTION 61%
1.0000 mL | Freq: Once | INTRAVENOUS | Status: AC | PRN
  Administered 2016-03-17: 1 mL

## 2016-03-17 MED ORDER — TRIAMCINOLONE ACETONIDE 40 MG/ML IJ SUSP (RADIOLOGY)
60.0000 mg | Freq: Once | INTRAMUSCULAR | Status: AC
  Administered 2016-03-17: 60 mg via EPIDURAL

## 2016-03-17 NOTE — Discharge Instructions (Signed)

## 2017-12-30 ENCOUNTER — Other Ambulatory Visit (HOSPITAL_COMMUNITY): Payer: Self-pay | Admitting: Pulmonary Disease

## 2017-12-30 DIAGNOSIS — K1123 Chronic sialoadenitis: Secondary | ICD-10-CM

## 2018-01-14 ENCOUNTER — Ambulatory Visit (HOSPITAL_COMMUNITY): Payer: BLUE CROSS/BLUE SHIELD

## 2018-01-21 ENCOUNTER — Ambulatory Visit (HOSPITAL_COMMUNITY)
Admission: RE | Admit: 2018-01-21 | Discharge: 2018-01-21 | Disposition: A | Discharge status: Home / Self Care | Payer: BC Managed Care – PPO | Source: Ambulatory Visit | Attending: Pulmonary Disease | Admitting: Pulmonary Disease

## 2018-01-21 DIAGNOSIS — D110 Benign neoplasm of parotid gland: Secondary | ICD-10-CM | POA: Diagnosis not present

## 2018-01-21 DIAGNOSIS — K1123 Chronic sialoadenitis: Secondary | ICD-10-CM

## 2018-01-21 LAB — POCT I-STAT CREATININE: CREATININE: 0.5 mg/dL (ref 0.44–1.00)

## 2018-01-21 LAB — POCT I-STAT TROPONIN I: TROPONIN I, POC: 0 ng/mL (ref 0.00–0.08)

## 2018-01-21 MED ORDER — IOPAMIDOL (ISOVUE-300) INJECTION 61%
100.0000 mL | Freq: Once | INTRAVENOUS | Status: AC | PRN
  Administered 2018-01-21: 100 mL via INTRAVENOUS

## 2018-12-16 DIAGNOSIS — Z6841 Body Mass Index (BMI) 40.0 and over, adult: Secondary | ICD-10-CM | POA: Diagnosis not present

## 2018-12-16 DIAGNOSIS — M5010 Cervical disc disorder with radiculopathy, unspecified cervical region: Secondary | ICD-10-CM | POA: Diagnosis not present

## 2018-12-16 DIAGNOSIS — M5020 Other cervical disc displacement, unspecified cervical region: Secondary | ICD-10-CM | POA: Diagnosis not present

## 2019-01-25 DIAGNOSIS — M5412 Radiculopathy, cervical region: Secondary | ICD-10-CM | POA: Diagnosis not present

## 2019-05-15 DIAGNOSIS — E1165 Type 2 diabetes mellitus with hyperglycemia: Secondary | ICD-10-CM | POA: Diagnosis not present

## 2019-05-15 DIAGNOSIS — M545 Low back pain: Secondary | ICD-10-CM | POA: Diagnosis not present

## 2019-05-15 DIAGNOSIS — E785 Hyperlipidemia, unspecified: Secondary | ICD-10-CM | POA: Diagnosis not present

## 2019-05-15 DIAGNOSIS — G4700 Insomnia, unspecified: Secondary | ICD-10-CM | POA: Diagnosis not present

## 2020-02-15 DIAGNOSIS — D485 Neoplasm of uncertain behavior of skin: Secondary | ICD-10-CM | POA: Diagnosis not present

## 2020-02-15 DIAGNOSIS — D220 Melanocytic nevi of lip: Secondary | ICD-10-CM | POA: Diagnosis not present

## 2020-02-15 DIAGNOSIS — L309 Dermatitis, unspecified: Secondary | ICD-10-CM | POA: Diagnosis not present

## 2020-05-09 DIAGNOSIS — Z1389 Encounter for screening for other disorder: Secondary | ICD-10-CM | POA: Diagnosis not present

## 2020-05-09 DIAGNOSIS — M5412 Radiculopathy, cervical region: Secondary | ICD-10-CM | POA: Diagnosis not present

## 2020-05-09 DIAGNOSIS — Z6841 Body Mass Index (BMI) 40.0 and over, adult: Secondary | ICD-10-CM | POA: Diagnosis not present

## 2020-05-09 DIAGNOSIS — E1165 Type 2 diabetes mellitus with hyperglycemia: Secondary | ICD-10-CM | POA: Diagnosis not present

## 2020-05-09 DIAGNOSIS — Z0001 Encounter for general adult medical examination with abnormal findings: Secondary | ICD-10-CM | POA: Diagnosis not present

## 2020-05-09 DIAGNOSIS — E7849 Other hyperlipidemia: Secondary | ICD-10-CM | POA: Diagnosis not present

## 2020-05-09 DIAGNOSIS — Z0000 Encounter for general adult medical examination without abnormal findings: Secondary | ICD-10-CM | POA: Diagnosis not present

## 2020-06-24 DIAGNOSIS — Z20828 Contact with and (suspected) exposure to other viral communicable diseases: Secondary | ICD-10-CM | POA: Diagnosis not present

## 2020-09-16 DIAGNOSIS — E1165 Type 2 diabetes mellitus with hyperglycemia: Secondary | ICD-10-CM | POA: Diagnosis not present

## 2020-09-16 DIAGNOSIS — I10 Essential (primary) hypertension: Secondary | ICD-10-CM | POA: Diagnosis not present

## 2020-09-16 DIAGNOSIS — Z6841 Body Mass Index (BMI) 40.0 and over, adult: Secondary | ICD-10-CM | POA: Diagnosis not present

## 2021-02-21 DIAGNOSIS — L814 Other melanin hyperpigmentation: Secondary | ICD-10-CM | POA: Diagnosis not present

## 2021-02-21 DIAGNOSIS — L821 Other seborrheic keratosis: Secondary | ICD-10-CM | POA: Diagnosis not present

## 2021-02-21 DIAGNOSIS — C4359 Malignant melanoma of other part of trunk: Secondary | ICD-10-CM | POA: Diagnosis not present

## 2021-02-21 DIAGNOSIS — D485 Neoplasm of uncertain behavior of skin: Secondary | ICD-10-CM | POA: Diagnosis not present

## 2021-02-28 DIAGNOSIS — M722 Plantar fascial fibromatosis: Secondary | ICD-10-CM | POA: Diagnosis not present

## 2021-02-28 DIAGNOSIS — M84375A Stress fracture, left foot, initial encounter for fracture: Secondary | ICD-10-CM | POA: Diagnosis not present

## 2021-02-28 DIAGNOSIS — M7672 Peroneal tendinitis, left leg: Secondary | ICD-10-CM | POA: Diagnosis not present

## 2021-02-28 DIAGNOSIS — M79672 Pain in left foot: Secondary | ICD-10-CM | POA: Diagnosis not present

## 2021-03-21 DIAGNOSIS — M722 Plantar fascial fibromatosis: Secondary | ICD-10-CM | POA: Diagnosis not present

## 2021-03-21 DIAGNOSIS — M7672 Peroneal tendinitis, left leg: Secondary | ICD-10-CM | POA: Diagnosis not present

## 2021-03-21 DIAGNOSIS — M79672 Pain in left foot: Secondary | ICD-10-CM | POA: Diagnosis not present

## 2021-03-26 DIAGNOSIS — D0359 Melanoma in situ of other part of trunk: Secondary | ICD-10-CM | POA: Diagnosis not present

## 2021-03-26 DIAGNOSIS — C4359 Malignant melanoma of other part of trunk: Secondary | ICD-10-CM | POA: Diagnosis not present

## 2021-04-09 DIAGNOSIS — D485 Neoplasm of uncertain behavior of skin: Secondary | ICD-10-CM | POA: Diagnosis not present

## 2021-04-09 DIAGNOSIS — Z85820 Personal history of malignant melanoma of skin: Secondary | ICD-10-CM | POA: Diagnosis not present

## 2021-04-17 DIAGNOSIS — E782 Mixed hyperlipidemia: Secondary | ICD-10-CM | POA: Diagnosis not present

## 2021-04-17 DIAGNOSIS — E1165 Type 2 diabetes mellitus with hyperglycemia: Secondary | ICD-10-CM | POA: Diagnosis not present

## 2021-04-17 DIAGNOSIS — Z1331 Encounter for screening for depression: Secondary | ICD-10-CM | POA: Diagnosis not present

## 2021-04-17 DIAGNOSIS — R635 Abnormal weight gain: Secondary | ICD-10-CM | POA: Diagnosis not present

## 2021-04-17 DIAGNOSIS — Z6841 Body Mass Index (BMI) 40.0 and over, adult: Secondary | ICD-10-CM | POA: Diagnosis not present

## 2021-04-17 DIAGNOSIS — I10 Essential (primary) hypertension: Secondary | ICD-10-CM | POA: Diagnosis not present

## 2021-04-17 DIAGNOSIS — Z1389 Encounter for screening for other disorder: Secondary | ICD-10-CM | POA: Diagnosis not present

## 2021-04-18 DIAGNOSIS — M722 Plantar fascial fibromatosis: Secondary | ICD-10-CM | POA: Diagnosis not present

## 2021-04-18 DIAGNOSIS — M7672 Peroneal tendinitis, left leg: Secondary | ICD-10-CM | POA: Diagnosis not present

## 2021-04-18 DIAGNOSIS — M79672 Pain in left foot: Secondary | ICD-10-CM | POA: Diagnosis not present

## 2021-07-09 DIAGNOSIS — D2262 Melanocytic nevi of left upper limb, including shoulder: Secondary | ICD-10-CM | POA: Diagnosis not present

## 2021-07-09 DIAGNOSIS — Z85820 Personal history of malignant melanoma of skin: Secondary | ICD-10-CM | POA: Diagnosis not present

## 2021-07-09 DIAGNOSIS — D2261 Melanocytic nevi of right upper limb, including shoulder: Secondary | ICD-10-CM | POA: Diagnosis not present

## 2021-07-09 DIAGNOSIS — D2362 Other benign neoplasm of skin of left upper limb, including shoulder: Secondary | ICD-10-CM | POA: Diagnosis not present

## 2021-09-11 DIAGNOSIS — E1165 Type 2 diabetes mellitus with hyperglycemia: Secondary | ICD-10-CM | POA: Diagnosis not present

## 2021-09-11 DIAGNOSIS — I10 Essential (primary) hypertension: Secondary | ICD-10-CM | POA: Diagnosis not present

## 2021-09-11 DIAGNOSIS — E782 Mixed hyperlipidemia: Secondary | ICD-10-CM | POA: Diagnosis not present

## 2021-09-11 DIAGNOSIS — Z6838 Body mass index (BMI) 38.0-38.9, adult: Secondary | ICD-10-CM | POA: Diagnosis not present

## 2021-09-11 DIAGNOSIS — J09X2 Influenza due to identified novel influenza A virus with other respiratory manifestations: Secondary | ICD-10-CM | POA: Diagnosis not present

## 2021-11-11 DIAGNOSIS — Z85820 Personal history of malignant melanoma of skin: Secondary | ICD-10-CM | POA: Diagnosis not present

## 2021-11-11 DIAGNOSIS — D225 Melanocytic nevi of trunk: Secondary | ICD-10-CM | POA: Diagnosis not present

## 2021-11-11 DIAGNOSIS — L821 Other seborrheic keratosis: Secondary | ICD-10-CM | POA: Diagnosis not present

## 2021-11-11 DIAGNOSIS — D224 Melanocytic nevi of scalp and neck: Secondary | ICD-10-CM | POA: Diagnosis not present

## 2022-04-23 DIAGNOSIS — Z85820 Personal history of malignant melanoma of skin: Secondary | ICD-10-CM | POA: Diagnosis not present

## 2022-04-23 DIAGNOSIS — D225 Melanocytic nevi of trunk: Secondary | ICD-10-CM | POA: Diagnosis not present

## 2022-04-23 DIAGNOSIS — D220 Melanocytic nevi of lip: Secondary | ICD-10-CM | POA: Diagnosis not present

## 2022-04-23 DIAGNOSIS — D2362 Other benign neoplasm of skin of left upper limb, including shoulder: Secondary | ICD-10-CM | POA: Diagnosis not present

## 2022-05-01 DIAGNOSIS — E28310 Symptomatic premature menopause: Secondary | ICD-10-CM | POA: Diagnosis not present

## 2022-05-01 DIAGNOSIS — R5383 Other fatigue: Secondary | ICD-10-CM | POA: Diagnosis not present

## 2022-05-01 DIAGNOSIS — M25512 Pain in left shoulder: Secondary | ICD-10-CM | POA: Diagnosis not present

## 2022-05-01 DIAGNOSIS — Z6839 Body mass index (BMI) 39.0-39.9, adult: Secondary | ICD-10-CM | POA: Diagnosis not present

## 2022-05-01 DIAGNOSIS — E782 Mixed hyperlipidemia: Secondary | ICD-10-CM | POA: Diagnosis not present

## 2022-05-01 DIAGNOSIS — E1165 Type 2 diabetes mellitus with hyperglycemia: Secondary | ICD-10-CM | POA: Diagnosis not present

## 2022-05-01 DIAGNOSIS — I10 Essential (primary) hypertension: Secondary | ICD-10-CM | POA: Diagnosis not present

## 2022-05-25 DIAGNOSIS — M7502 Adhesive capsulitis of left shoulder: Secondary | ICD-10-CM | POA: Diagnosis not present

## 2022-06-09 DIAGNOSIS — M7502 Adhesive capsulitis of left shoulder: Secondary | ICD-10-CM | POA: Diagnosis not present

## 2022-06-18 DIAGNOSIS — M7502 Adhesive capsulitis of left shoulder: Secondary | ICD-10-CM | POA: Diagnosis not present

## 2022-06-23 DIAGNOSIS — M7502 Adhesive capsulitis of left shoulder: Secondary | ICD-10-CM | POA: Diagnosis not present

## 2022-06-29 DIAGNOSIS — M7502 Adhesive capsulitis of left shoulder: Secondary | ICD-10-CM | POA: Diagnosis not present

## 2022-07-06 DIAGNOSIS — M7502 Adhesive capsulitis of left shoulder: Secondary | ICD-10-CM | POA: Diagnosis not present

## 2022-07-08 DIAGNOSIS — M7502 Adhesive capsulitis of left shoulder: Secondary | ICD-10-CM | POA: Diagnosis not present

## 2022-07-21 DIAGNOSIS — M7502 Adhesive capsulitis of left shoulder: Secondary | ICD-10-CM | POA: Diagnosis not present

## 2022-08-04 DIAGNOSIS — M7502 Adhesive capsulitis of left shoulder: Secondary | ICD-10-CM | POA: Diagnosis not present

## 2022-08-07 DIAGNOSIS — Z6838 Body mass index (BMI) 38.0-38.9, adult: Secondary | ICD-10-CM | POA: Diagnosis not present

## 2022-08-07 DIAGNOSIS — I10 Essential (primary) hypertension: Secondary | ICD-10-CM | POA: Diagnosis not present

## 2022-08-07 DIAGNOSIS — E782 Mixed hyperlipidemia: Secondary | ICD-10-CM | POA: Diagnosis not present

## 2022-08-07 DIAGNOSIS — Z1331 Encounter for screening for depression: Secondary | ICD-10-CM | POA: Diagnosis not present

## 2022-08-07 DIAGNOSIS — E28310 Symptomatic premature menopause: Secondary | ICD-10-CM | POA: Diagnosis not present

## 2022-08-07 DIAGNOSIS — Z0001 Encounter for general adult medical examination with abnormal findings: Secondary | ICD-10-CM | POA: Diagnosis not present

## 2022-08-07 DIAGNOSIS — D170 Benign lipomatous neoplasm of skin and subcutaneous tissue of head, face and neck: Secondary | ICD-10-CM | POA: Diagnosis not present

## 2022-08-07 DIAGNOSIS — E1165 Type 2 diabetes mellitus with hyperglycemia: Secondary | ICD-10-CM | POA: Diagnosis not present

## 2022-08-07 DIAGNOSIS — E7849 Other hyperlipidemia: Secondary | ICD-10-CM | POA: Diagnosis not present

## 2022-08-07 DIAGNOSIS — M7502 Adhesive capsulitis of left shoulder: Secondary | ICD-10-CM | POA: Diagnosis not present

## 2022-08-11 DIAGNOSIS — M7502 Adhesive capsulitis of left shoulder: Secondary | ICD-10-CM | POA: Diagnosis not present

## 2022-08-14 DIAGNOSIS — M7502 Adhesive capsulitis of left shoulder: Secondary | ICD-10-CM | POA: Diagnosis not present

## 2022-08-17 DIAGNOSIS — M7502 Adhesive capsulitis of left shoulder: Secondary | ICD-10-CM | POA: Diagnosis not present

## 2022-12-16 ENCOUNTER — Ambulatory Visit: Payer: BC Managed Care – PPO | Admitting: Internal Medicine

## 2023-03-11 DIAGNOSIS — E782 Mixed hyperlipidemia: Secondary | ICD-10-CM | POA: Diagnosis not present

## 2023-03-11 DIAGNOSIS — Z6838 Body mass index (BMI) 38.0-38.9, adult: Secondary | ICD-10-CM | POA: Diagnosis not present

## 2023-03-11 DIAGNOSIS — E1165 Type 2 diabetes mellitus with hyperglycemia: Secondary | ICD-10-CM | POA: Diagnosis not present

## 2023-03-11 DIAGNOSIS — E6609 Other obesity due to excess calories: Secondary | ICD-10-CM | POA: Diagnosis not present

## 2023-03-11 DIAGNOSIS — I10 Essential (primary) hypertension: Secondary | ICD-10-CM | POA: Diagnosis not present

## 2023-03-11 DIAGNOSIS — E28310 Symptomatic premature menopause: Secondary | ICD-10-CM | POA: Diagnosis not present

## 2023-04-26 DIAGNOSIS — L905 Scar conditions and fibrosis of skin: Secondary | ICD-10-CM | POA: Diagnosis not present

## 2023-04-26 DIAGNOSIS — D2362 Other benign neoplasm of skin of left upper limb, including shoulder: Secondary | ICD-10-CM | POA: Diagnosis not present

## 2023-04-26 DIAGNOSIS — Z85820 Personal history of malignant melanoma of skin: Secondary | ICD-10-CM | POA: Diagnosis not present

## 2023-04-26 DIAGNOSIS — D225 Melanocytic nevi of trunk: Secondary | ICD-10-CM | POA: Diagnosis not present

## 2023-05-14 DIAGNOSIS — E0590 Thyrotoxicosis, unspecified without thyrotoxic crisis or storm: Secondary | ICD-10-CM | POA: Diagnosis not present

## 2023-06-23 ENCOUNTER — Ambulatory Visit: Payer: BC Managed Care – PPO | Attending: Internal Medicine | Admitting: Internal Medicine

## 2023-06-24 ENCOUNTER — Encounter: Payer: Self-pay | Admitting: Internal Medicine

## 2023-10-22 DIAGNOSIS — E0590 Thyrotoxicosis, unspecified without thyrotoxic crisis or storm: Secondary | ICD-10-CM | POA: Diagnosis not present

## 2023-10-22 DIAGNOSIS — Z6841 Body Mass Index (BMI) 40.0 and over, adult: Secondary | ICD-10-CM | POA: Diagnosis not present

## 2023-10-22 DIAGNOSIS — E1165 Type 2 diabetes mellitus with hyperglycemia: Secondary | ICD-10-CM | POA: Diagnosis not present

## 2024-04-17 DIAGNOSIS — D220 Melanocytic nevi of lip: Secondary | ICD-10-CM | POA: Diagnosis not present

## 2024-04-17 DIAGNOSIS — D225 Melanocytic nevi of trunk: Secondary | ICD-10-CM | POA: Diagnosis not present

## 2024-04-17 DIAGNOSIS — Z85820 Personal history of malignant melanoma of skin: Secondary | ICD-10-CM | POA: Diagnosis not present

## 2024-04-17 DIAGNOSIS — L905 Scar conditions and fibrosis of skin: Secondary | ICD-10-CM | POA: Diagnosis not present
